# Patient Record
Sex: Male | Born: 1960
Health system: Southern US, Community
[De-identification: ages and names within clinical notes are randomized; demographics above are authoritative.]

## PROBLEM LIST (undated history)

## (undated) DIAGNOSIS — G4700 Insomnia, unspecified: Secondary | ICD-10-CM

## (undated) DIAGNOSIS — N451 Epididymitis: Secondary | ICD-10-CM

## (undated) DIAGNOSIS — G43909 Migraine, unspecified, not intractable, without status migrainosus: Secondary | ICD-10-CM

## (undated) DIAGNOSIS — F341 Dysthymic disorder: Secondary | ICD-10-CM

## (undated) DIAGNOSIS — N419 Inflammatory disease of prostate, unspecified: Secondary | ICD-10-CM

## (undated) DIAGNOSIS — F329 Major depressive disorder, single episode, unspecified: Secondary | ICD-10-CM

## (undated) DIAGNOSIS — N200 Calculus of kidney: Secondary | ICD-10-CM

## (undated) DIAGNOSIS — E785 Hyperlipidemia, unspecified: Secondary | ICD-10-CM

## (undated) DIAGNOSIS — I10 Essential (primary) hypertension: Secondary | ICD-10-CM

## (undated) DIAGNOSIS — E669 Obesity, unspecified: Secondary | ICD-10-CM

## (undated) DIAGNOSIS — F32A Depression, unspecified: Secondary | ICD-10-CM

## (undated) DIAGNOSIS — E119 Type 2 diabetes mellitus without complications: Secondary | ICD-10-CM

## (undated) HISTORY — DX: Depression, unspecified: F32.A

## (undated) HISTORY — DX: Hyperlipidemia, unspecified: E78.5

## (undated) HISTORY — DX: Epididymitis: N45.1

## (undated) HISTORY — DX: Major depressive disorder, single episode, unspecified: F32.9

## (undated) HISTORY — DX: Insomnia, unspecified: G47.00

## (undated) HISTORY — DX: Dysthymic disorder: F34.1

## (undated) HISTORY — DX: Calculus of kidney: N20.0

## (undated) HISTORY — DX: Inflammatory disease of prostate, unspecified: N41.9

## (undated) HISTORY — DX: Migraine, unspecified, not intractable, without status migrainosus: G43.909

## (undated) HISTORY — DX: Type 2 diabetes mellitus without complications: E11.9

## (undated) HISTORY — DX: Essential (primary) hypertension: I10

## (undated) HISTORY — DX: Obesity, unspecified: E66.9

---

## 2009-03-23 ENCOUNTER — Emergency Department: Payer: Self-pay | Admitting: Emergency Medicine

## 2009-10-05 ENCOUNTER — Emergency Department: Payer: Self-pay | Admitting: Emergency Medicine

## 2010-11-19 ENCOUNTER — Emergency Department (HOSPITAL_COMMUNITY)
Admission: EM | Admit: 2010-11-19 | Discharge: 2010-11-19 | Payer: Self-pay | Source: Home / Self Care | Admitting: Emergency Medicine

## 2011-02-15 ENCOUNTER — Ambulatory Visit
Admission: RE | Admit: 2011-02-15 | Discharge: 2011-02-15 | Disposition: A | Discharge status: Home / Self Care | Payer: BC Managed Care – PPO | Source: Ambulatory Visit | Attending: Internal Medicine | Admitting: Internal Medicine

## 2011-02-15 ENCOUNTER — Other Ambulatory Visit: Payer: Self-pay | Admitting: Internal Medicine

## 2011-02-15 DIAGNOSIS — R519 Headache, unspecified: Secondary | ICD-10-CM

## 2011-11-18 IMAGING — CR DG RIBS W/ CHEST 3+V*R*
5 series · 5 of 5 positions shown · non-contrast
Comparison: None

CLINICAL DATA: Fell landing on right side, right lower rib pain

RIGHT RIBS AND CHEST - 3+ VIEW

[w chest pa]
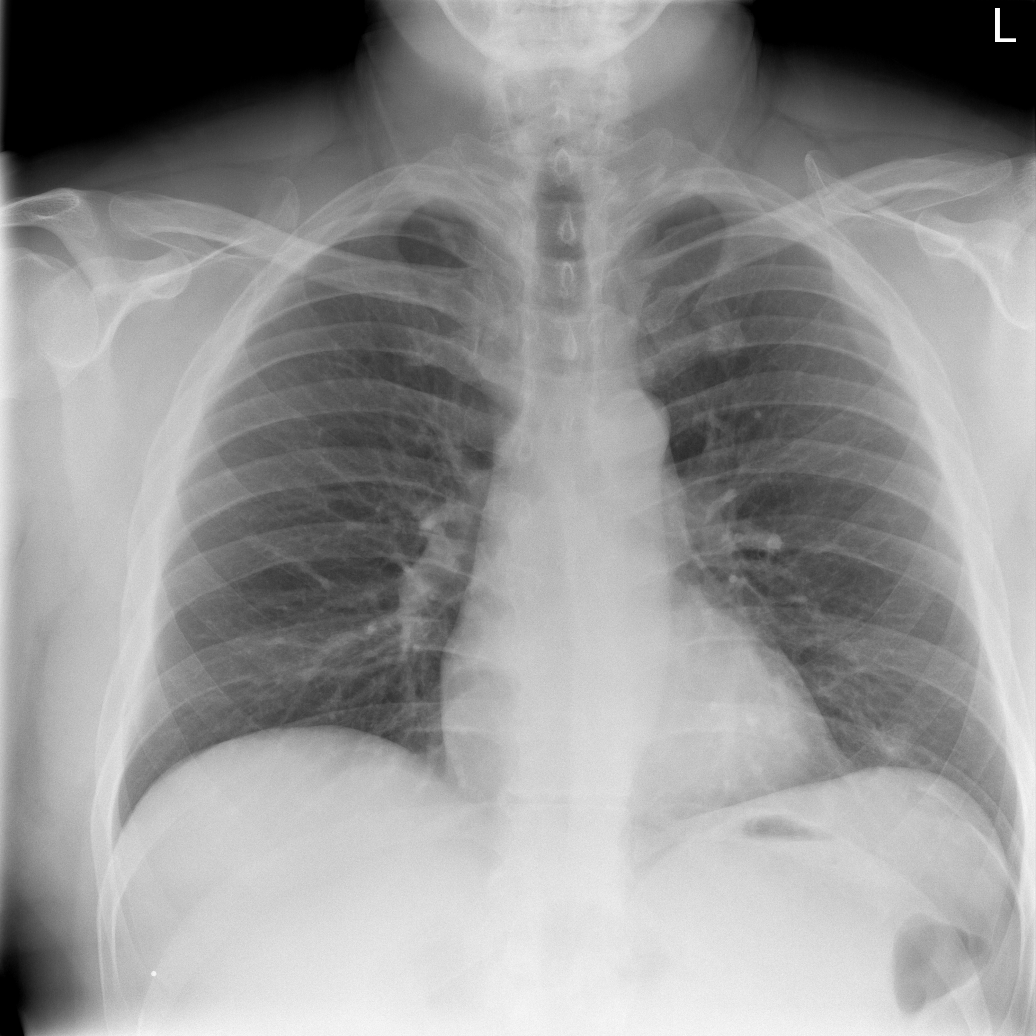

[w ribs ap/pa upper right *]
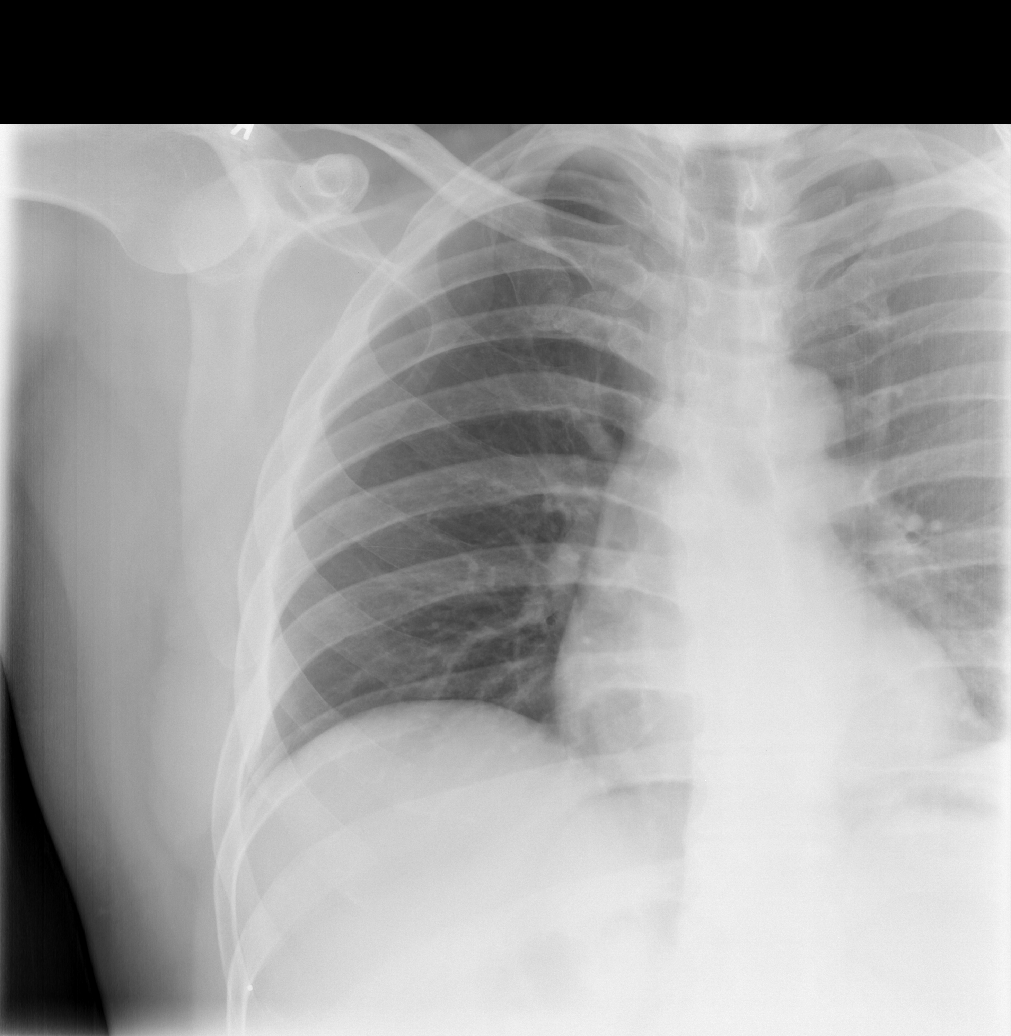

[w ribs ap/pa lower right *]
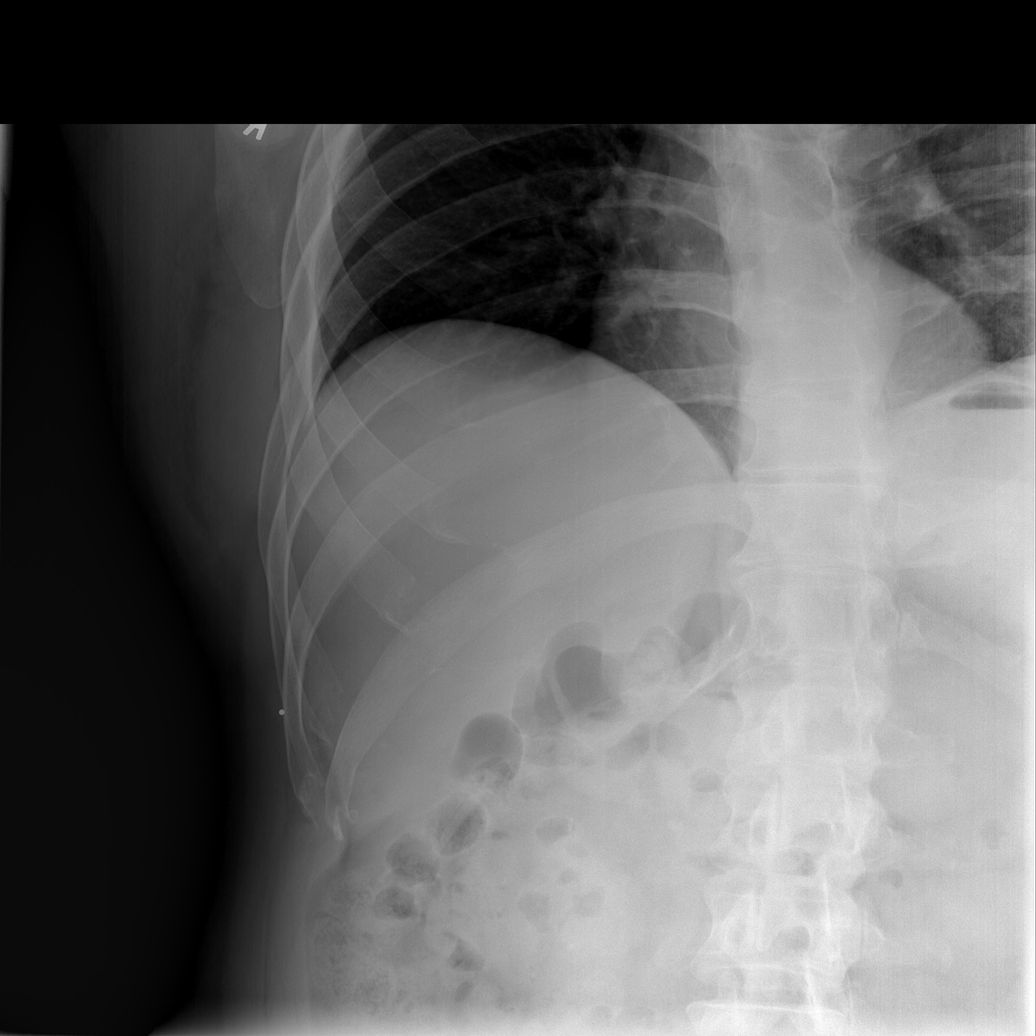

[w ribs oblique right * (1 of 2)]
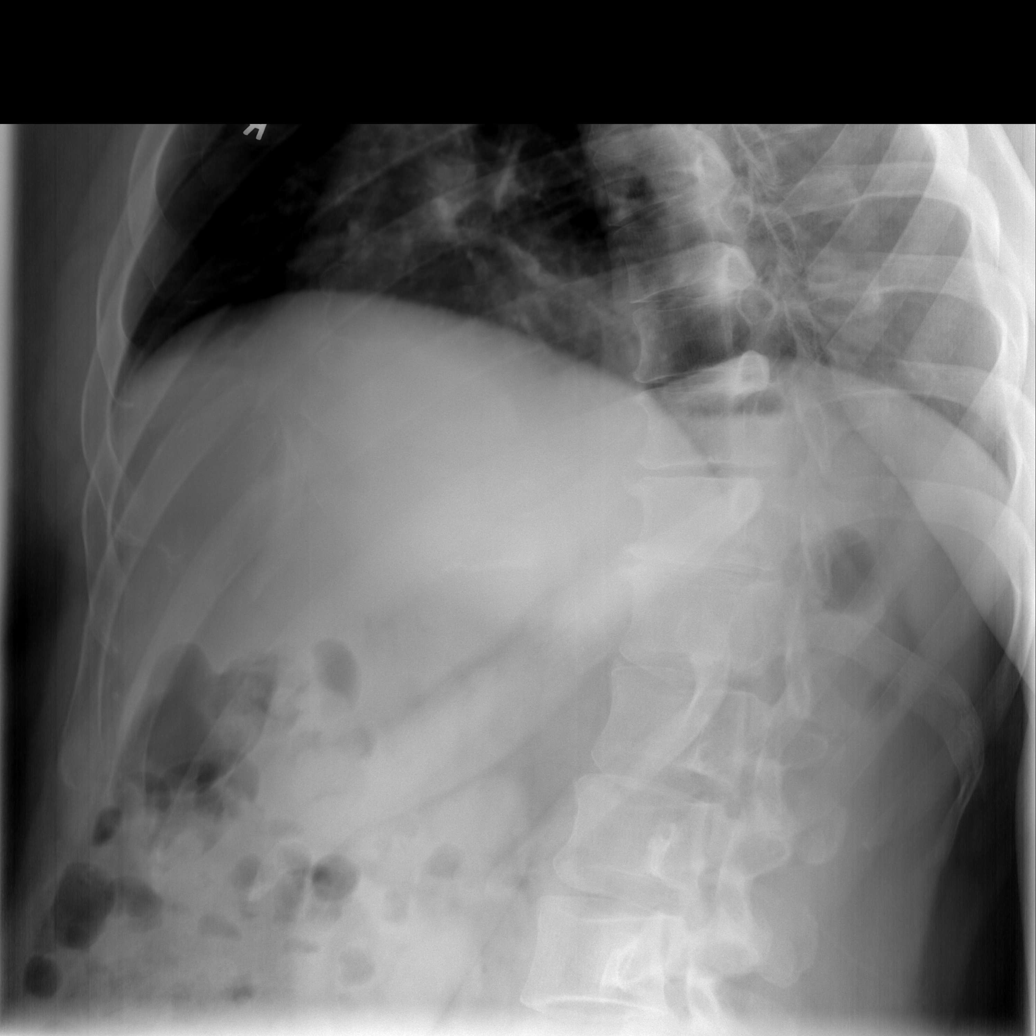

[w ribs oblique right * (2 of 2)]
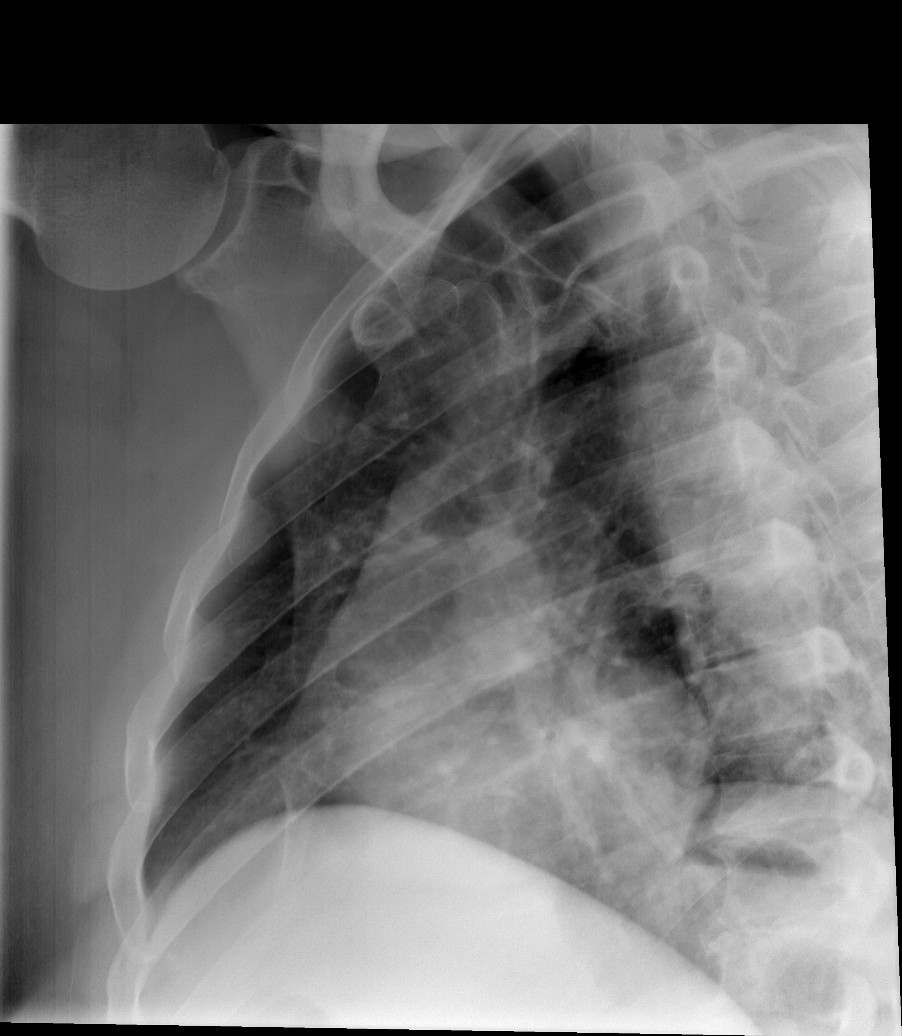

[5 of 5 positions shown; findings below may reference images not displayed]

FINDINGS: Normal heart size, mediastinal contours, and pulmonary vascularity.
Lungs clear.
Question nodular density versus nipple shadow at left lung base.
Fracture posterolateral right 8th rib.
No additional fractures identified.
No pleural effusion or pneumothorax.
IMPRESSION: Fracture right eighth rib.
Questionable nodular density versus nipple shadow left lung base.
Repeat PA chest radiograph with nipple markers recommended to
exclude pulmonary nodule.

## 2011-11-18 IMAGING — CR DG PELVIS 1-2V
1 series · 1 of 1 positions shown · non-contrast
Comparison: None

CLINICAL DATA: Right hip soreness/pain post fall

PELVIS - 1-2 VIEW

[t pelvis a.p. *]
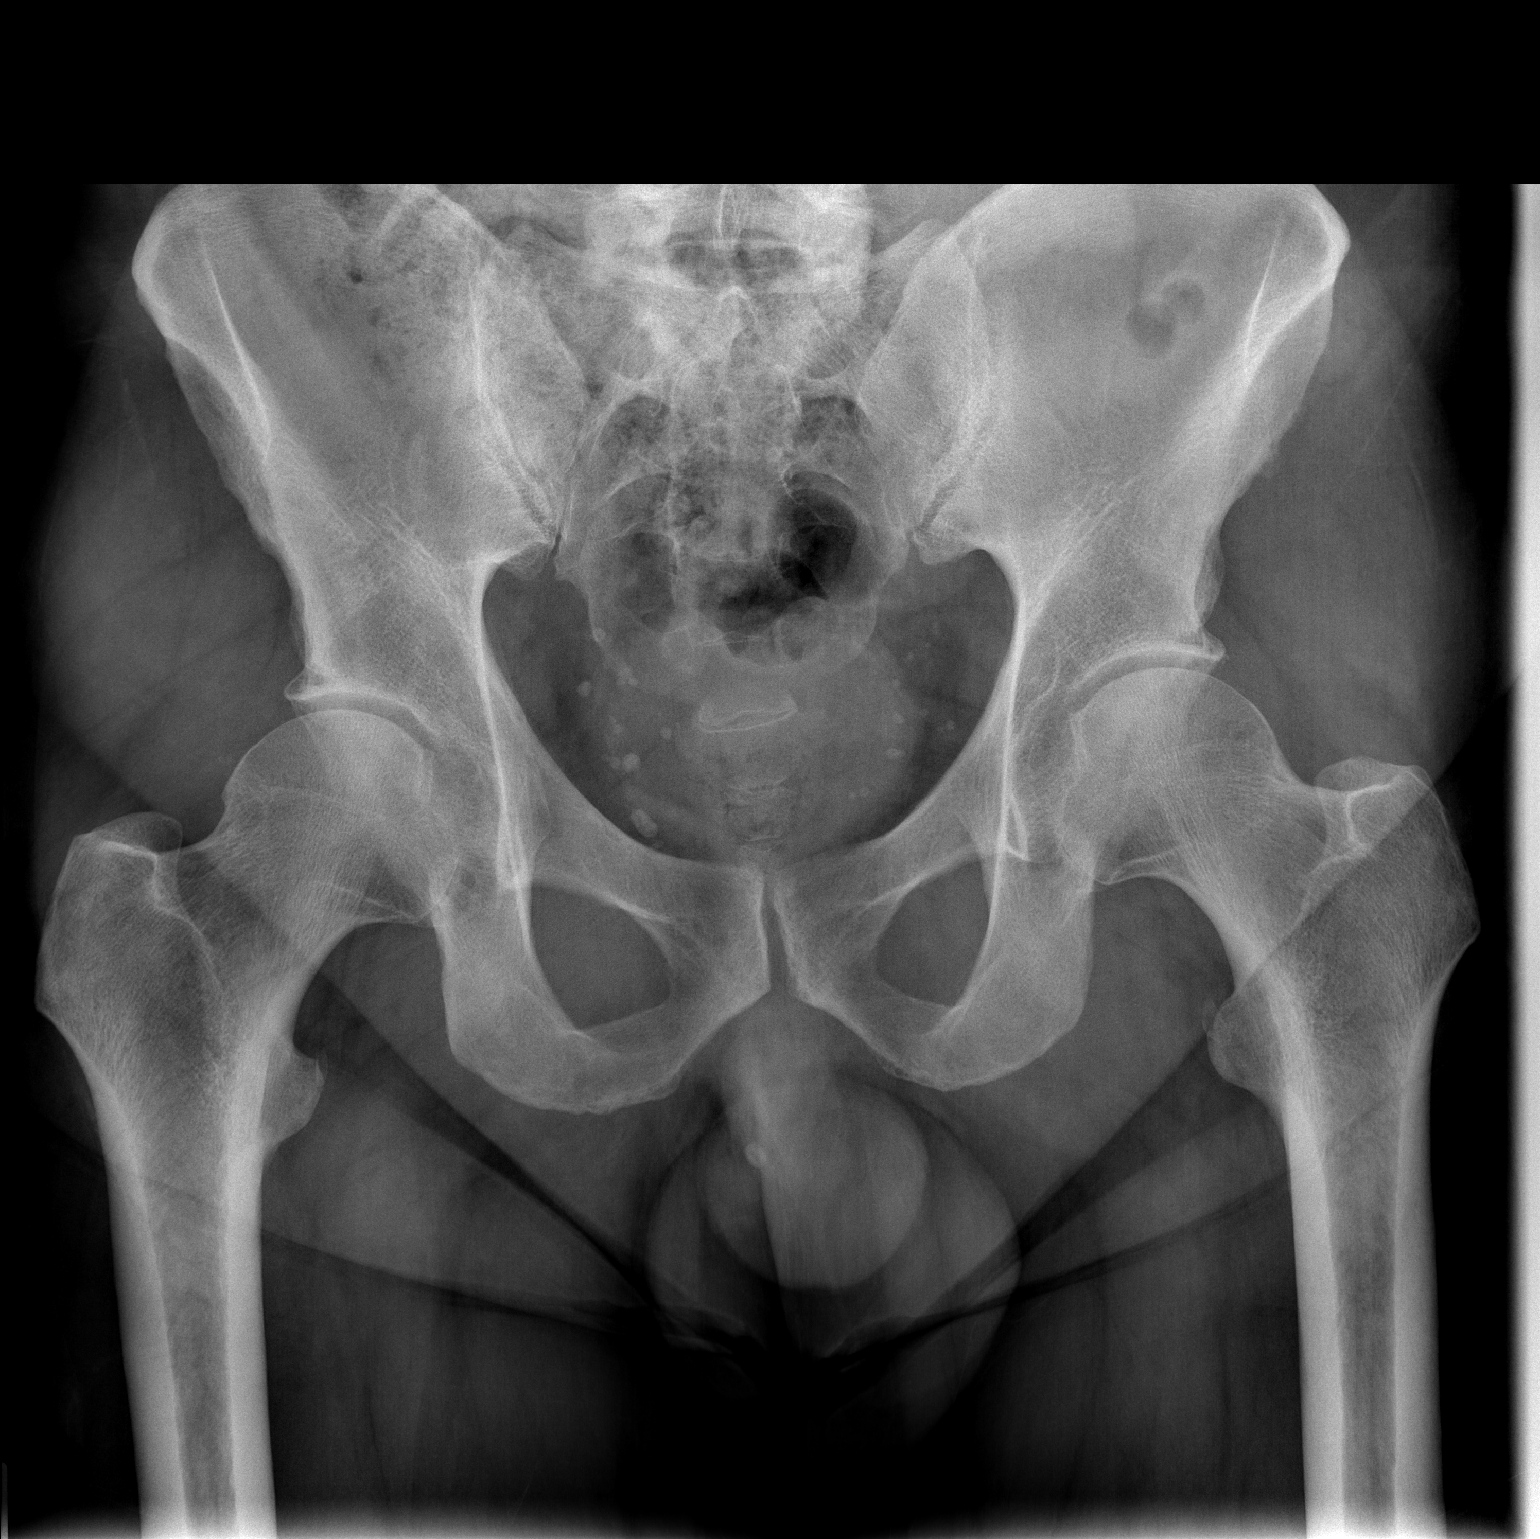

[1 of 1 positions shown; findings below may reference images not displayed]

FINDINGS: Symmetric hip and SI joints.
Osseous mineralization grossly normal.
Bilateral pelvic phleboliths.
No fracture, dislocation or bone destruction.
IMPRESSION: No acute osseous abnormalities.

## 2014-03-03 ENCOUNTER — Encounter: Payer: Self-pay | Admitting: Internal Medicine

## 2014-04-20 ENCOUNTER — Encounter: Payer: Self-pay | Admitting: Internal Medicine

## 2014-04-22 ENCOUNTER — Ambulatory Visit: Payer: BC Managed Care – PPO | Admitting: Internal Medicine

## 2017-08-20 DIAGNOSIS — Z1389 Encounter for screening for other disorder: Secondary | ICD-10-CM | POA: Diagnosis not present

## 2017-08-20 DIAGNOSIS — I10 Essential (primary) hypertension: Secondary | ICD-10-CM | POA: Diagnosis not present

## 2017-08-20 DIAGNOSIS — E119 Type 2 diabetes mellitus without complications: Secondary | ICD-10-CM | POA: Diagnosis not present

## 2017-10-20 DIAGNOSIS — E119 Type 2 diabetes mellitus without complications: Secondary | ICD-10-CM | POA: Diagnosis not present

## 2017-11-28 ENCOUNTER — Encounter: Payer: Self-pay | Admitting: Adult Health

## 2018-02-12 DIAGNOSIS — M79643 Pain in unspecified hand: Secondary | ICD-10-CM | POA: Diagnosis not present

## 2018-02-12 DIAGNOSIS — Z1389 Encounter for screening for other disorder: Secondary | ICD-10-CM | POA: Diagnosis not present

## 2018-02-12 DIAGNOSIS — L853 Xerosis cutis: Secondary | ICD-10-CM | POA: Diagnosis not present

## 2018-02-12 DIAGNOSIS — E119 Type 2 diabetes mellitus without complications: Secondary | ICD-10-CM | POA: Diagnosis not present

## 2018-06-16 DIAGNOSIS — I10 Essential (primary) hypertension: Secondary | ICD-10-CM | POA: Diagnosis not present

## 2018-06-16 DIAGNOSIS — R109 Unspecified abdominal pain: Secondary | ICD-10-CM | POA: Diagnosis not present

## 2018-06-16 DIAGNOSIS — E119 Type 2 diabetes mellitus without complications: Secondary | ICD-10-CM | POA: Diagnosis not present

## 2018-11-05 DIAGNOSIS — R82998 Other abnormal findings in urine: Secondary | ICD-10-CM | POA: Diagnosis not present

## 2018-11-05 DIAGNOSIS — E119 Type 2 diabetes mellitus without complications: Secondary | ICD-10-CM | POA: Diagnosis not present

## 2018-11-05 DIAGNOSIS — I10 Essential (primary) hypertension: Secondary | ICD-10-CM | POA: Diagnosis not present

## 2018-12-14 DIAGNOSIS — M545 Low back pain: Secondary | ICD-10-CM | POA: Diagnosis not present

## 2018-12-14 DIAGNOSIS — G894 Chronic pain syndrome: Secondary | ICD-10-CM | POA: Diagnosis not present

## 2018-12-14 DIAGNOSIS — G8929 Other chronic pain: Secondary | ICD-10-CM | POA: Diagnosis not present

## 2018-12-14 DIAGNOSIS — R51 Headache: Secondary | ICD-10-CM | POA: Diagnosis not present

## 2018-12-14 DIAGNOSIS — Z79891 Long term (current) use of opiate analgesic: Secondary | ICD-10-CM | POA: Diagnosis not present

## 2018-12-14 DIAGNOSIS — G44221 Chronic tension-type headache, intractable: Secondary | ICD-10-CM | POA: Diagnosis not present

## 2018-12-28 DIAGNOSIS — G894 Chronic pain syndrome: Secondary | ICD-10-CM | POA: Diagnosis not present

## 2018-12-28 DIAGNOSIS — Z79891 Long term (current) use of opiate analgesic: Secondary | ICD-10-CM | POA: Diagnosis not present

## 2018-12-28 DIAGNOSIS — R51 Headache: Secondary | ICD-10-CM | POA: Diagnosis not present

## 2018-12-28 DIAGNOSIS — G8929 Other chronic pain: Secondary | ICD-10-CM | POA: Diagnosis not present

## 2018-12-28 DIAGNOSIS — M545 Low back pain: Secondary | ICD-10-CM | POA: Diagnosis not present

## 2018-12-28 DIAGNOSIS — G44221 Chronic tension-type headache, intractable: Secondary | ICD-10-CM | POA: Diagnosis not present

## 2019-01-14 DIAGNOSIS — H2513 Age-related nuclear cataract, bilateral: Secondary | ICD-10-CM | POA: Diagnosis not present

## 2019-01-28 DIAGNOSIS — Z79891 Long term (current) use of opiate analgesic: Secondary | ICD-10-CM | POA: Diagnosis not present

## 2019-01-28 DIAGNOSIS — G894 Chronic pain syndrome: Secondary | ICD-10-CM | POA: Diagnosis not present

## 2019-01-28 DIAGNOSIS — G8929 Other chronic pain: Secondary | ICD-10-CM | POA: Diagnosis not present

## 2019-01-28 DIAGNOSIS — G44221 Chronic tension-type headache, intractable: Secondary | ICD-10-CM | POA: Diagnosis not present

## 2019-01-28 DIAGNOSIS — M545 Low back pain: Secondary | ICD-10-CM | POA: Diagnosis not present

## 2019-01-28 DIAGNOSIS — R51 Headache: Secondary | ICD-10-CM | POA: Diagnosis not present

## 2019-02-26 DIAGNOSIS — Z79891 Long term (current) use of opiate analgesic: Secondary | ICD-10-CM | POA: Diagnosis not present

## 2019-02-26 DIAGNOSIS — M545 Low back pain: Secondary | ICD-10-CM | POA: Diagnosis not present

## 2019-02-26 DIAGNOSIS — G894 Chronic pain syndrome: Secondary | ICD-10-CM | POA: Diagnosis not present

## 2019-02-26 DIAGNOSIS — G8929 Other chronic pain: Secondary | ICD-10-CM | POA: Diagnosis not present

## 2019-02-26 DIAGNOSIS — R51 Headache: Secondary | ICD-10-CM | POA: Diagnosis not present

## 2019-03-18 DIAGNOSIS — E669 Obesity, unspecified: Secondary | ICD-10-CM | POA: Diagnosis not present

## 2019-03-18 DIAGNOSIS — I10 Essential (primary) hypertension: Secondary | ICD-10-CM | POA: Diagnosis not present

## 2019-03-18 DIAGNOSIS — E119 Type 2 diabetes mellitus without complications: Secondary | ICD-10-CM | POA: Diagnosis not present

## 2019-03-26 DIAGNOSIS — R51 Headache: Secondary | ICD-10-CM | POA: Diagnosis not present

## 2019-03-26 DIAGNOSIS — G44221 Chronic tension-type headache, intractable: Secondary | ICD-10-CM | POA: Diagnosis not present

## 2019-03-26 DIAGNOSIS — M545 Low back pain: Secondary | ICD-10-CM | POA: Diagnosis not present

## 2019-03-27 DIAGNOSIS — G8929 Other chronic pain: Secondary | ICD-10-CM | POA: Diagnosis not present

## 2019-03-27 DIAGNOSIS — Z79891 Long term (current) use of opiate analgesic: Secondary | ICD-10-CM | POA: Diagnosis not present

## 2019-03-27 DIAGNOSIS — G894 Chronic pain syndrome: Secondary | ICD-10-CM | POA: Diagnosis not present

## 2019-11-15 ENCOUNTER — Other Ambulatory Visit: Payer: Self-pay | Admitting: Anesthesiology

## 2019-11-15 DIAGNOSIS — M48061 Spinal stenosis, lumbar region without neurogenic claudication: Secondary | ICD-10-CM

## 2019-12-06 ENCOUNTER — Inpatient Hospital Stay: Admission: RE | Admit: 2019-12-06 | Payer: Self-pay | Source: Ambulatory Visit

## 2019-12-23 ENCOUNTER — Ambulatory Visit
Admission: RE | Admit: 2019-12-23 | Discharge: 2019-12-23 | Disposition: A | Discharge status: Home / Self Care | Payer: 59 | Source: Ambulatory Visit | Attending: Anesthesiology | Admitting: Anesthesiology

## 2019-12-23 ENCOUNTER — Other Ambulatory Visit: Payer: Self-pay

## 2019-12-23 DIAGNOSIS — M48061 Spinal stenosis, lumbar region without neurogenic claudication: Secondary | ICD-10-CM

## 2019-12-23 MED ORDER — IOPAMIDOL (ISOVUE-M 200) INJECTION 41%
1.0000 mL | Freq: Once | INTRAMUSCULAR | Status: AC
  Administered 2019-12-23: 1 mL via EPIDURAL

## 2019-12-23 MED ORDER — METHYLPREDNISOLONE ACETATE 40 MG/ML INJ SUSP (RADIOLOG
120.0000 mg | Freq: Once | INTRAMUSCULAR | Status: AC
  Administered 2019-12-23: 120 mg via EPIDURAL

## 2019-12-23 NOTE — Discharge Instructions (Signed)

## 2020-03-13 ENCOUNTER — Other Ambulatory Visit: Payer: Self-pay | Admitting: Anesthesiology

## 2020-03-13 DIAGNOSIS — M5136 Other intervertebral disc degeneration, lumbar region: Secondary | ICD-10-CM

## 2020-03-23 ENCOUNTER — Ambulatory Visit
Admission: RE | Admit: 2020-03-23 | Discharge: 2020-03-23 | Disposition: A | Discharge status: Home / Self Care | Payer: 59 | Source: Ambulatory Visit | Attending: Anesthesiology | Admitting: Anesthesiology

## 2020-03-23 ENCOUNTER — Other Ambulatory Visit: Payer: Self-pay

## 2020-03-23 DIAGNOSIS — M5136 Other intervertebral disc degeneration, lumbar region: Secondary | ICD-10-CM

## 2020-03-23 MED ORDER — IOPAMIDOL (ISOVUE-M 200) INJECTION 41%
1.0000 mL | Freq: Once | INTRAMUSCULAR | Status: AC
  Administered 2020-03-23: 09:00:00 1 mL via EPIDURAL

## 2020-03-23 MED ORDER — METHYLPREDNISOLONE ACETATE 40 MG/ML INJ SUSP (RADIOLOG
120.0000 mg | Freq: Once | INTRAMUSCULAR | Status: AC
  Administered 2020-03-23: 09:00:00 120 mg via EPIDURAL

## 2020-03-23 NOTE — Discharge Instructions (Signed)

## 2020-06-15 ENCOUNTER — Other Ambulatory Visit: Payer: Self-pay | Admitting: Anesthesiology

## 2020-06-15 DIAGNOSIS — G8929 Other chronic pain: Secondary | ICD-10-CM

## 2020-06-15 DIAGNOSIS — M545 Low back pain, unspecified: Secondary | ICD-10-CM

## 2020-07-05 ENCOUNTER — Other Ambulatory Visit: Payer: Self-pay

## 2020-07-05 ENCOUNTER — Ambulatory Visit
Admission: RE | Admit: 2020-07-05 | Discharge: 2020-07-05 | Disposition: A | Discharge status: Home / Self Care | Payer: 59 | Source: Ambulatory Visit | Attending: Anesthesiology | Admitting: Anesthesiology

## 2020-07-05 DIAGNOSIS — G8929 Other chronic pain: Secondary | ICD-10-CM

## 2020-07-05 DIAGNOSIS — M545 Low back pain, unspecified: Secondary | ICD-10-CM

## 2020-07-05 MED ORDER — IOPAMIDOL (ISOVUE-M 200) INJECTION 41%
1.0000 mL | Freq: Once | INTRAMUSCULAR | Status: AC
  Administered 2020-07-05: 1 mL via EPIDURAL

## 2020-07-05 MED ORDER — METHYLPREDNISOLONE ACETATE 40 MG/ML INJ SUSP (RADIOLOG
120.0000 mg | Freq: Once | INTRAMUSCULAR | Status: AC
  Administered 2020-07-05: 120 mg via EPIDURAL

## 2020-07-05 NOTE — Discharge Instructions (Signed)

## 2020-08-10 ENCOUNTER — Ambulatory Visit
Admission: RE | Admit: 2020-08-10 | Discharge: 2020-08-10 | Disposition: A | Discharge status: Home / Self Care | Payer: 59 | Source: Ambulatory Visit | Attending: Anesthesiology | Admitting: Anesthesiology

## 2020-08-10 ENCOUNTER — Other Ambulatory Visit: Payer: Self-pay | Admitting: Anesthesiology

## 2020-08-10 DIAGNOSIS — M1711 Unilateral primary osteoarthritis, right knee: Secondary | ICD-10-CM

## 2021-06-27 ENCOUNTER — Ambulatory Visit: Payer: 59 | Attending: Orthopedic Surgery | Admitting: Physical Therapy

## 2021-06-27 ENCOUNTER — Other Ambulatory Visit: Payer: Self-pay

## 2021-06-27 VITALS — BP 129/74 | HR 78

## 2021-06-27 DIAGNOSIS — M5412 Radiculopathy, cervical region: Secondary | ICD-10-CM | POA: Diagnosis not present

## 2021-06-27 DIAGNOSIS — M542 Cervicalgia: Secondary | ICD-10-CM

## 2021-06-27 DIAGNOSIS — M79601 Pain in right arm: Secondary | ICD-10-CM | POA: Diagnosis present
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
## 2021-06-28 ENCOUNTER — Encounter: Payer: Self-pay | Admitting: Physical Therapy

## 2021-07-02 ENCOUNTER — Telehealth: Payer: Self-pay | Admitting: Physical Therapy

## 2021-07-02 ENCOUNTER — Ambulatory Visit: Payer: 59 | Admitting: Physical Therapy

## 2021-07-02 NOTE — Telephone Encounter (Signed)
Called pt to inquiry about whether he could attend earlier apt, but he needs to cancel for today and Wednesday because he will be out of town. He is still experiencing vibrating sensation as well as weakness on left arm when lifting objects.

## 2021-07-04 ENCOUNTER — Ambulatory Visit: Payer: 59 | Admitting: Physical Therapy

## 2021-07-09 ENCOUNTER — Other Ambulatory Visit: Payer: Self-pay

## 2021-07-09 ENCOUNTER — Ambulatory Visit: Payer: 59 | Attending: Orthopedic Surgery | Admitting: Physical Therapy

## 2021-07-09 DIAGNOSIS — M79601 Pain in right arm: Secondary | ICD-10-CM

## 2021-07-09 DIAGNOSIS — M542 Cervicalgia: Secondary | ICD-10-CM | POA: Diagnosis present

## 2021-07-09 DIAGNOSIS — M5412 Radiculopathy, cervical region: Secondary | ICD-10-CM

## 2021-07-09 NOTE — Therapy (Addendum)
Patient has not returned since 07/09/21 and he will now be discharged. He was instructed by [facility] physician that his condition would need to be medically managed. Pt only present two visits and goals were not met.   Bradly Chris PT, DPT     [place] [facility] AND [facility]OR[facility]IC[facility] [address], [place], [address] Phone: [phone]   Fax:  [phone]  Physical Therapy Treatment/Discharge Summary   Patient Details  Name: Bobby Avila MRN: [id number] Date of Birth: 26-Mar-1961 Referring Provider (PT): Netta Cedars, MD   Encounter Date: 07/09/2021    07/09/21 1901  PT Visits / Re-Eval  Visit Number 2  Number of Visits 11  Date for PT Re-Evaluation 08/01/21  PT Time Calculation  PT Start Time 1800  PT Stop Time 1845  PT Time Calculation (min) 45 min  PT - End of Session  Activity Tolerance Patient tolerated treatment well  Behavior During Therapy [facility] for tasks assessed/performed    Past Medical History:  Diagnosis Date   Diabetes ([place])    Dysthymic disorder    Epididymitis    Hyperlipidemia    Hypertension    Insomnia    Kidney stone    Migraine    Mood disorder of depressed type    Obesity    Prostatitis     No past surgical history on file.  There were no vitals filed for this visit.   Subjective Assessment - 07/09/21 1810     Subjective Pt reports that the pain in his left shoulder continues to increase and that he feels excrutiating pain when lifting objects overhead even with object only being a couple of pounds.    Pertinent History Left shoulder pain started 2 weeks ago. Does have appt tomorrow at [facility] pain clinic, goes 1st Wed of every month, Rt knee pain and bulging disc, gets injections, [facility] pain clinic in [place] and shots administered by [facility], Refill Zolpidem, Refills/Alprazolam, Control refill, EMR-Mig, EMR-Mig, EMR-Mig, EMR-Mig, EMR-Mig    Limitations Lifting    How long can you sit  comfortably? N/a    How long can you stand comfortably? N/a    How long can you walk comfortably? N/a    Patient Stated Goals To relieve the pain and vibrating feeling down his arm and to have him get his left arm strength back    Currently in Pain? Yes    Pain Score 7     Pain Location Shoulder    Pain Orientation Left    Pain Descriptors / Indicators Stabbing    Pain Type Acute pain    Pain Onset More than a month ago            EVAL:   Left Biceps Tendon TTP   Speed's test + on Left Arm   Internal Rotation=External Rotation - LHB tendinopathy, AC joint pathology, THEREX:  Seated Rows 2 x 10 with Black TB   Push Up with Plus 2 x 10   Shoulder T's 2 x 10 -VC to stop elevating shoulders   Shoulder Flexion Eccentric with Red TB 1 x10  -VC to decreased speed of shoulder extension      Updated HEP and educated patient on changes to exercises and addition of new exercises        PT Short Term Goals - 07/09/21 1856       PT SHORT TERM GOAL #1   Title Patient demonstrates understanding and independence of HEP.  Time 2    Period Weeks    Status New    Target Date 07/11/21               PT Long Term Goals - 07/09/21 1856       PT LONG TERM GOAL #1   Title Patient will have improved function and activity level as evidenced by an increase in FOTO score by 10 points or more.    Baseline 06/28/21: 36/56    Time 5    Period Weeks    Status New      PT LONG TERM GOAL #2   Title Patient will reduce RUE pain to <=2/10 in order to resume carrying out lifting for job duties.    Baseline 7/28: 5/10    Time 5    Period Weeks    Status Achieved             HEP includes following exercises:   Access Code: [email or web address] URL: [email or web address] Date: 07/09/2021 Prepared by: Bradly Chris  Exercises Radial Nerve Flossing - 1 x daily - 7 x weekly - 2 sets - 10 reps Seated Cervical Retraction - 1 x daily - 7 x weekly - 3 sets - 10  reps Seated Shoulder Row with Anchored Resistance - 1 x daily - 3 x weekly - 2 sets - 10 reps Standing Eccentric Shoulder Flexion at Wall - 1 x daily - 3 x weekly - 2 sets - 10 reps Standing Shoulder Horizontal Abduction with Resistance - 1 x daily - 3 x weekly - 2 sets - 10 reps Wall Push Up with Plus - 1 x daily - 3 x weekly - 2 sets - 10 reps    Plan - 07/09/21 1856     Clinical Impression Statement Pt presents for f/u for cervicalgia. After further eval, pt exhibits signs and symptoms suggestive of biceps tendinopathy with tenderness over biceps tendon and increased pain with resisted left shoulder flexion. Pt able to tolerate all exercises without an increase in left shoulder pain. He will continue to benefit from skilled PT to progress left shoulder ROM without experiencing increased pain in order to lift objects overhead for his job.    Personal Factors and Comorbidities Time since onset of injury/illness/exacerbation    Examination-Activity Limitations Lift    Examination-Participation Restrictions Occupation    Stability/Clinical Decision Making Evolving/Moderate complexity    Rehab Potential Fair    PT Frequency 2x / week    PT Duration Other (comment)   5   PT Treatment/Interventions Dry needling;Joint Manipulations;Moist Heat;Therapeutic exercise;Passive range of motion    PT Next Visit Plan thoracic outlet eval, progress cervical and periscapular strengthening, progress bicep eccentric exercises             Patient will benefit from skilled therapeutic intervention in order to improve the following deficits and impairments:  Pain, Impaired UE functional use  Visit Diagnosis: Radiculopathy, cervical region  Pain in right arm  Cervicalgia     Problem List There are no problems to display for this patient.  Bradly Chris PT, DPT  07/09/2021, 7:00 PM  [place] [facility]OR[facility]IC[facility] [address], [place],  [address] Phone: [phone]   Fax:  [phone]  Name: Franck Vinal MRN: [id number] Date of Birth: [birth year]

## 2021-07-11 ENCOUNTER — Ambulatory Visit: Payer: 59 | Admitting: Physical Therapy

## 2021-07-11 ENCOUNTER — Telehealth: Payer: Self-pay | Admitting: Physical Therapy

## 2021-07-11 NOTE — Telephone Encounter (Signed)
Faxed [facility] for patient's note and images and send following message:  Dear Dr. Ranell Patrick, My name is Ellin Goodie, and I am the physical therapist at [facility] that is treating Bobby Avila. He was a patient of yours that was referred to me for cervical pain with possible nerve impingement. Could you please send me his notes including impression of his imaging, so I can gain a better understanding of his underlying condition. I appreciate your time and you can fax information to our clinic at [phone]. Please let me know if you have any additional questions and I hope to hear from you soon.  Sincerely,  Ellin Goodie PT, DPT

## 2021-07-16 ENCOUNTER — Ambulatory Visit: Payer: 59 | Admitting: Physical Therapy

## 2021-07-18 ENCOUNTER — Encounter: Payer: 59 | Admitting: Physical Therapy

## 2021-07-23 ENCOUNTER — Encounter: Payer: 59 | Admitting: Physical Therapy

## 2021-07-25 ENCOUNTER — Encounter: Payer: 59 | Admitting: Physical Therapy

## 2021-07-30 ENCOUNTER — Ambulatory Visit: Payer: 59 | Admitting: Physical Therapy

## 2021-08-01 ENCOUNTER — Ambulatory Visit: Payer: 59 | Admitting: Physical Therapy

## 2021-08-07 ENCOUNTER — Encounter: Payer: 59 | Admitting: Physical Therapy

## 2021-08-09 ENCOUNTER — Encounter: Payer: 59 | Admitting: Physical Therapy

## 2021-08-09 IMAGING — CR DG KNEE 1-2V*R*
1 series · 2 of 2 positions shown · non-contrast
Comparison: None.

CLINICAL DATA: Right knee pain for 6 weeks

EXAM:
RIGHT KNEE - 1-2 VIEW

[Series 1: dg knee 1-2 views right · 0.14mm/px · 2 of 2 slices shown]
[im 1/2]
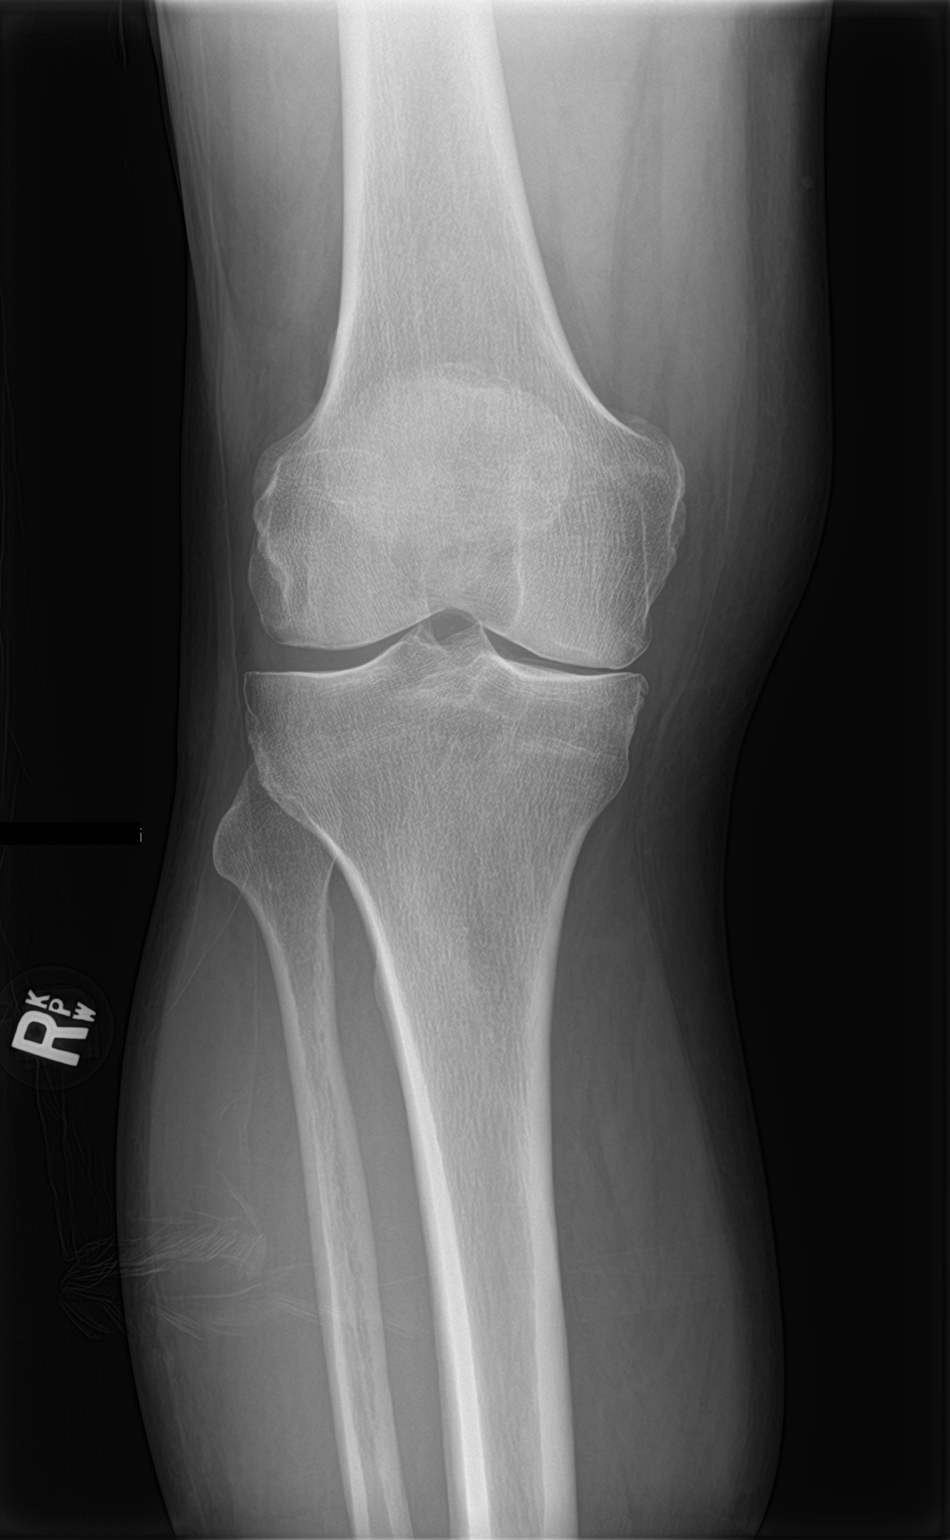
[im 2/2]
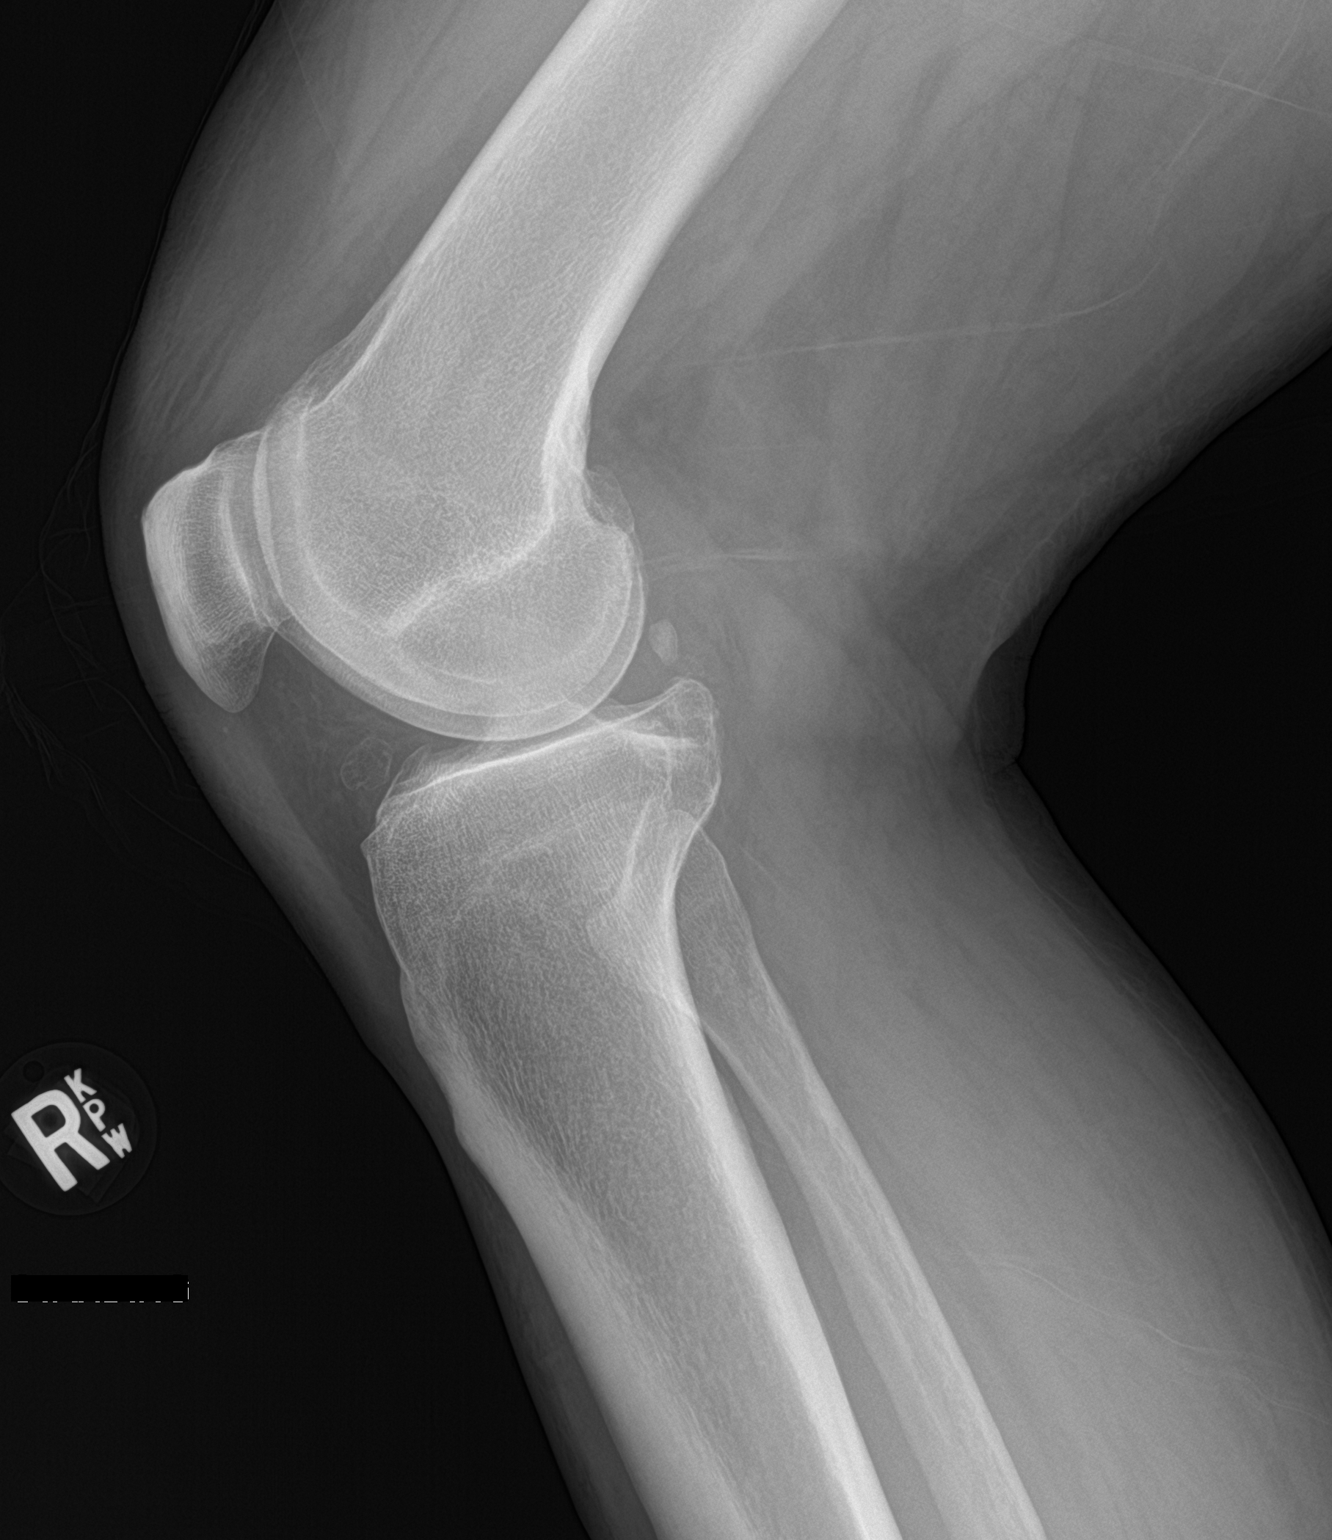

[2 of 2 positions shown; findings below may reference images not displayed]

FINDINGS: No fracture or malalignment. Mild patellofemoral and moderate medial
joint space degenerative change. Probable trace knee effusion.
Probable partially calcified loose body along the anterior joint.
IMPRESSION: Tricompartment arthritis of the knee, most advanced involving the
medial compartment. Probable partially calcified loose body at the
anterior joint.

## 2021-08-13 ENCOUNTER — Encounter: Payer: 59 | Admitting: Physical Therapy

## 2021-08-15 ENCOUNTER — Encounter: Payer: 59 | Admitting: Physical Therapy

## 2021-12-27 ENCOUNTER — Encounter: Payer: Self-pay | Admitting: Physical Therapy

## 2023-02-03 ENCOUNTER — Emergency Department
Admission: EM | Admit: 2023-02-03 | Discharge: 2023-02-03 | Disposition: A | Discharge status: Home / Self Care | Payer: Commercial Managed Care - PPO | Attending: Emergency Medicine | Admitting: Emergency Medicine

## 2023-02-03 ENCOUNTER — Emergency Department: Payer: Commercial Managed Care - PPO

## 2023-02-03 ENCOUNTER — Other Ambulatory Visit: Payer: Self-pay

## 2023-02-03 DIAGNOSIS — R319 Hematuria, unspecified: Secondary | ICD-10-CM | POA: Diagnosis not present

## 2023-02-03 DIAGNOSIS — N200 Calculus of kidney: Secondary | ICD-10-CM

## 2023-02-03 DIAGNOSIS — I10 Essential (primary) hypertension: Secondary | ICD-10-CM | POA: Diagnosis not present

## 2023-02-03 DIAGNOSIS — R1031 Right lower quadrant pain: Secondary | ICD-10-CM | POA: Diagnosis present

## 2023-02-03 DIAGNOSIS — E119 Type 2 diabetes mellitus without complications: Secondary | ICD-10-CM | POA: Diagnosis not present

## 2023-02-03 DIAGNOSIS — N132 Hydronephrosis with renal and ureteral calculous obstruction: Secondary | ICD-10-CM | POA: Insufficient documentation

## 2023-02-03 LAB — URINALYSIS, W/ REFLEX TO CULTURE (INFECTION SUSPECTED)
Bacteria, UA: NONE SEEN
Bilirubin Urine: NEGATIVE
Glucose, UA: >=500 mg/dL — AB
Ketones, ur: 5 mg/dL — AB
Leukocytes,Ua: NEGATIVE
Nitrite: NEGATIVE
Protein, ur: NEGATIVE mg/dL
RBC / HPF: >50 RBC/hpf (ref 0–5)
Specific Gravity, Urine: 1.023 (ref 1.005–1.030)
Squamous Epithelial / HPF: NONE SEEN /HPF (ref 0–5)
pH: 5 (ref 5.0–8.0)

## 2023-02-03 LAB — COMPREHENSIVE METABOLIC PANEL
ALT: 28 U/L (ref 0–44)
AST: 27 U/L (ref 15–41)
Albumin: 4.1 g/dL (ref 3.5–5.0)
Alkaline Phosphatase: 37 U/L — ABNORMAL LOW (ref 38–126)
Anion gap: 14 (ref 5–15)
BUN: 28 mg/dL — ABNORMAL HIGH (ref 6–20)
CO2: 17 mmol/L — ABNORMAL LOW (ref 22–32)
Calcium: 9.2 mg/dL (ref 8.9–10.3)
Chloride: 102 mmol/L (ref 98–111)
Creatinine, Ser: 0.88 mg/dL (ref 0.61–1.24)
GFR, Estimated: >60 mL/min (ref >60)
Glucose, Bld: 175 mg/dL — ABNORMAL HIGH (ref 70–99)
Potassium: 3.9 mmol/L (ref 3.5–5.1)
Sodium: 133 mmol/L — ABNORMAL LOW (ref 135–145)
Total Bilirubin: 0.8 mg/dL (ref 0.3–1.2)
Total Protein: 7.1 g/dL (ref 6.5–8.1)

## 2023-02-03 LAB — CBC
HCT: 52 % (ref 39.0–52.0)
Hemoglobin: 17.5 g/dL — ABNORMAL HIGH (ref 13.0–17.0)
MCH: 30.5 pg (ref 26.0–34.0)
MCHC: 33.7 g/dL (ref 30.0–36.0)
MCV: 90.8 fL (ref 80.0–100.0)
Platelets: 164 10*3/uL (ref 150–400)
RBC: 5.73 MIL/uL (ref 4.22–5.81)
RDW: 12.4 % (ref 11.5–15.5)
WBC: 8.8 10*3/uL (ref 4.0–10.5)
nRBC: 0 % (ref 0.0–0.2)

## 2023-02-03 LAB — LIPASE, BLOOD: Lipase: 51 U/L (ref 11–51)

## 2023-02-03 MED ORDER — KETOROLAC TROMETHAMINE 30 MG/ML IJ SOLN
15.0000 mg | Freq: Once | INTRAMUSCULAR | Status: AC
  Administered 2023-02-03: 15 mg via INTRAVENOUS
  Filled 2023-02-03: qty 1

## 2023-02-03 MED ORDER — ONDANSETRON 4 MG PO TBDP: 4.0000 mg | ORAL_TABLET | Freq: Three times a day (TID) | ORAL | 0 refills | Status: AC | PRN

## 2023-02-03 MED ORDER — HYDROMORPHONE HCL 1 MG/ML IJ SOLN
0.5000 mg | Freq: Once | INTRAMUSCULAR | Status: AC
  Administered 2023-02-03: 0.5 mg via INTRAVENOUS
  Filled 2023-02-03: qty 0.5

## 2023-02-03 MED ORDER — ONDANSETRON HCL 4 MG/2ML IJ SOLN
4.0000 mg | Freq: Once | INTRAMUSCULAR | Status: AC
  Administered 2023-02-03: 4 mg via INTRAVENOUS
  Filled 2023-02-03: qty 2

## 2023-02-03 MED ORDER — OXYCODONE-ACETAMINOPHEN 5-325 MG PO TABS: 1.0000 | ORAL_TABLET | ORAL | 0 refills | Status: AC | PRN

## 2023-02-03 MED ORDER — LACTATED RINGERS IV BOLUS
1000.0000 mL | Freq: Once | INTRAVENOUS | Status: AC
  Administered 2023-02-03: 1000 mL via INTRAVENOUS

## 2023-02-03 MED ORDER — ONDANSETRON 4 MG PO TBDP
4.0000 mg | ORAL_TABLET | Freq: Once | ORAL | Status: AC | PRN
  Administered 2023-02-03: 4 mg via ORAL
  Filled 2023-02-03: qty 1

## 2023-02-03 NOTE — ED Triage Notes (Addendum)
Patient reports pain that started Saturday morning on his right side. Patient states pain subsided on the right side and he felt pressure in his stomach and bowels. This morning the pain is lower in the abdomen. Pain states he has had kidney stones before and this feels similar but he is also concerned  for appendicitis. Pain is rated 9/10. Pain feels nauseous while in triage.

## 2023-02-03 NOTE — ED Provider Notes (Signed)
[facility] Provider Note    Event Date/Time   First MD Initiated Contact with Patient 02/03/23 0701     (approximate)   History   Chief Complaint Flank Pain   HPI  Bobby Avila is a 62 y.o. male with past medical history of hypertension, hyperlipidemia, diabetes, migraines, and kidney stones who presents to the ED complaining of flank pain.  Patient reports that he has been dealing with waxing and waning pain in his right flank for the past 3 days.  Pain initially started higher up in his flank but is since started moving around to the right lower quadrant of his abdomen.  Pain has been sharp and severe at times but is not exacerbated or alleviated by anything in particular.  He reports feeling nauseous and vomited a couple of days ago but none since.  He denies any changes in his bowel movements, does state his urine has been dark but he has not had any fevers or dysuria.  He describes similar symptoms in the past with kidney stones.      Physical Exam   Triage Vital Signs: ED Triage Vitals  Enc Vitals Group     BP 02/03/23 0608 (!) 173/104     Pulse Rate 02/03/23 0608 79     Resp 02/03/23 0608 19     Temp 02/03/23 0608 98.1 F (36.7 C)     Temp Source 02/03/23 0608 Oral     SpO2 02/03/23 0608 97 %     Weight 02/03/23 0617 238 lb (108 kg)     Height 02/03/23 0617 6' (1.829 m)     Head Circumference --      Peak Flow --      Pain Score 02/03/23 0616 9     Pain Loc --      Pain Edu? --      Excl. in [place]? --     Most recent vital signs: Vitals:   02/03/23 0616 02/03/23 0815  BP:  (!) 175/97  Pulse:  69  Resp:    Temp:    SpO2: 97% 98%    Constitutional: Alert and oriented.  Uncomfortable appearing. Eyes: Conjunctivae are normal. Head: Atraumatic. Nose: No congestion/rhinnorhea. Mouth/Throat: Mucous membranes are moist.  Cardiovascular: Normal rate, regular rhythm. Grossly normal heart sounds.  2+ radial pulses  bilaterally. Respiratory: Normal respiratory effort.  No retractions. Lungs CTAB. Gastrointestinal: Soft and nontender.  No CVA tenderness bilaterally.  No distention. Musculoskeletal: No lower extremity tenderness nor edema.  Neurologic:  Normal speech and language. No gross focal neurologic deficits are appreciated.    ED Results / Procedures / Treatments   Labs (all labs ordered are listed, but only abnormal results are displayed) Labs Reviewed  CBC - Abnormal; Notable for the following components:      Result Value   Hemoglobin 17.5 (*)    All other components within normal limits  URINALYSIS, W/ REFLEX TO CULTURE (INFECTION SUSPECTED) - Abnormal; Notable for the following components:   Color, Urine STRAW (*)    APPearance CLEAR (*)    Glucose, UA >=500 (*)    Hgb urine dipstick LARGE (*)    Ketones, ur 5 (*)    All other components within normal limits  COMPREHENSIVE METABOLIC PANEL - Abnormal; Notable for the following components:   Sodium 133 (*)    CO2 17 (*)    Glucose, Bld 175 (*)    BUN 28 (*)    Alkaline Phosphatase 37 (*)  All other components within normal limits  LIPASE, BLOOD   RADIOLOGY CT abdomen/pelvis reviewed and interpreted by me with obstructing ureteral stone near the right UVJ.  PROCEDURES:  Critical Care performed: No  Procedures   MEDICATIONS ORDERED IN ED: Medications  ondansetron (ZOFRAN-ODT) disintegrating tablet 4 mg (4 mg Oral Given 02/03/23 0629)  HYDROmorphone (DILAUDID) injection 0.5 mg (0.5 mg Intravenous Given 02/03/23 0712)  lactated ringers bolus 1,000 mL (1,000 mLs Intravenous New Bag/Given 02/03/23 0716)  ondansetron (ZOFRAN) injection 4 mg (4 mg Intravenous Given 02/03/23 0749)  ketorolac (TORADOL) 30 MG/ML injection 15 mg (15 mg Intravenous Given 02/03/23 0811)     IMPRESSION / MDM / ASSESSMENT AND PLAN / ED COURSE  I reviewed the triage vital signs and the nursing notes.                              62 y.o. male with past  medical history of hypertension, hyperlipidemia, diabetes, migraines, and kidney stones who presents to the ED complaining of waxing and waning severe pain in his right flank migrating downwards over the past 3 days.  Patient's presentation is most consistent with acute presentation with potential threat to life or bodily function.  Differential diagnosis includes, but is not limited to, kidney stone, appendicitis, pyelonephritis, cholecystitis, biliary colic, AKI, electrolyte abnormality.  Patient uncomfortable appearing but nontoxic and in no acute distress, vital signs remarkable for hypertension but otherwise reassuring.  Pain is not reproducible with palpation in his costovertebral area or palpation of his abdomen, symptoms seem most consistent with kidney stone and we will assess with CT renal protocol.  Labs show elevated BUN to creatinine ratio but no significant AKI or electrolyte abnormality, LFTs and lipase are unremarkable.  No significant anemia or leukocytosis noted, urinalysis shows microscopic hematuria but no evidence of UTI.  We will treat symptomatically with IV Dilaudid, give follow-up dose of Zofran given ongoing nausea and hydrate with IV fluids.  CT imaging shows 4 mm obstructing stone at the right UVJ with associated hydronephrosis, no other acute findings noted.  Patient's pain now improved following IV Toradol and he is appropriate for discharge home with urology follow-up as needed.  He will be prescribed small amount of pain and nausea medication, was counseled to return to the ED for new or worsening symptoms.  Patient agrees with plan.      FINAL CLINICAL IMPRESSION(S) / ED DIAGNOSES   Final diagnoses:  Kidney stone     Rx / DC Orders   ED Discharge Orders          Ordered    oxyCODONE-acetaminophen (PERCOCET) 5-325 MG tablet  Every 4 hours PRN        02/03/23 0841    ondansetron (ZOFRAN-ODT) 4 MG disintegrating tablet  Every 8 hours PRN        02/03/23 0841              Note:  This document was prepared using Dragon voice recognition software and may include unintentional dictation errors.   Blake Divine, MD 02/03/23 [phone]
